# Patient Record
Sex: Male | Born: 1990 | Race: White | Hispanic: No | Marital: Married | Smoking: Never smoker
Health system: Southern US, Community
[De-identification: ages and names within clinical notes are randomized; demographics above are authoritative.]

## PROBLEM LIST (undated history)

## (undated) DIAGNOSIS — R011 Cardiac murmur, unspecified: Secondary | ICD-10-CM

## (undated) DIAGNOSIS — J302 Other seasonal allergic rhinitis: Secondary | ICD-10-CM

---
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
## 2017-06-12 DIAGNOSIS — J029 Acute pharyngitis, unspecified: Secondary | ICD-10-CM | POA: Diagnosis not present

## 2018-10-23 DIAGNOSIS — J029 Acute pharyngitis, unspecified: Secondary | ICD-10-CM | POA: Diagnosis not present

## 2019-10-30 ENCOUNTER — Other Ambulatory Visit: Payer: Self-pay

## 2019-10-30 DIAGNOSIS — Z20822 Contact with and (suspected) exposure to covid-19: Secondary | ICD-10-CM

## 2019-11-01 LAB — NOVEL CORONAVIRUS, NAA: SARS-CoV-2, NAA: NOT DETECTED

## 2020-04-04 ENCOUNTER — Ambulatory Visit (HOSPITAL_COMMUNITY)
Admission: EM | Admit: 2020-04-04 | Discharge: 2020-04-04 | Disposition: A | Discharge status: Home / Self Care | Payer: 59 | Attending: Emergency Medicine | Admitting: Emergency Medicine

## 2020-04-04 ENCOUNTER — Encounter (HOSPITAL_COMMUNITY): Payer: Self-pay

## 2020-04-04 ENCOUNTER — Other Ambulatory Visit: Payer: Self-pay

## 2020-04-04 DIAGNOSIS — R0789 Other chest pain: Secondary | ICD-10-CM | POA: Diagnosis not present

## 2020-04-04 HISTORY — DX: Other seasonal allergic rhinitis: J30.2

## 2020-04-04 HISTORY — DX: Cardiac murmur, unspecified: R01.1

## 2020-04-04 MED ORDER — IBUPROFEN 600 MG PO TABS: 600.0000 mg | ORAL_TABLET | Freq: Four times a day (QID) | ORAL | 0 refills | Status: AC | PRN

## 2020-04-04 MED ORDER — CYCLOBENZAPRINE HCL 10 MG PO TABS: 10.0000 mg | ORAL_TABLET | Freq: Two times a day (BID) | ORAL | 0 refills | Status: AC | PRN

## 2020-04-04 NOTE — Discharge Instructions (Addendum)
Take ibuprofen and muscle relaxant for pain. I also recommend flonase for allergies. You can take this along with Zyrtec. I have also sent a referral for and allergist for you.

## 2020-04-04 NOTE — ED Triage Notes (Signed)
Pt c/o 8/10 sharp right chest pain, pain in right side of neck, and pain in right upper back when he inhales. He states it started Monday night then went away. Then it began again last night. Pt has non labored breathing. Pt skin color is WNL.

## 2020-04-04 NOTE — ED Provider Notes (Signed)
[facility]    CSN: [id number] Arrival date & time: 04/04/20  1014      History   Chief Complaint Chief Complaint  Patient presents with  . Chest Pain    HPI Micheal Pratt is a 29 y.o. male. Presenting with Chest pain. Pain began 5 days ago and was located midline chest and radiated to his right chest and up to his right lateral neck. Pain was constant for 2-3 days and spontaneously resolved. He reports pain began again last night and woke him from sleep. Still present this morning. He reports a significant history of allergies that is poorly treated with zyrtec. He has had head congestion, rhinorrhea, sneezing and itchy eyes. Denies cough, chest pain or shortness of breath. Does not exercise regularly but has had significant amounts of stress as he is planning a large wedding in 2 weeks  HPI  Past Medical History:  Diagnosis Date  . Heart murmur   . Seasonal allergies     There are no problems to display for this patient.   History reviewed. No pertinent surgical history.     Home Medications    Prior to Admission medications   Medication Sig Start Date End Date Taking? Authorizing Provider  cetirizine (ZYRTEC) 10 MG tablet Take 10 mg by mouth daily.   Yes [provider]    Family History Family History  Problem Relation Age of Onset  . Healthy Mother   . Healthy Father     Social History Social History   Tobacco Use  . Smoking status: Never Smoker  . Smokeless tobacco: Never Used  Substance Use Topics  . Alcohol use: Never  . Drug use: Never     Allergies   Patient has no known allergies.   Review of Systems Review of Systems  Constitutional: Negative for chills, diaphoresis, fatigue, fever and unexpected weight change.  HENT: Positive for sinus pressure, sinus pain and sneezing. Negative for sore throat and trouble swallowing.   Eyes: Positive for itching. Negative for visual disturbance.  Respiratory: Negative for cough,  shortness of breath and wheezing.   Cardiovascular: Positive for chest pain. Negative for palpitations.  Gastrointestinal: Negative for abdominal pain, constipation, diarrhea, nausea and vomiting.  Genitourinary: Negative for difficulty urinating and frequency.  Musculoskeletal: Negative for arthralgias, back pain, joint swelling and myalgias.  Neurological: Negative for dizziness, weakness and headaches.  Psychiatric/Behavioral: Negative for dysphoric mood. The patient is nervous/anxious.      Physical Exam Triage Vital Signs ED Triage Vitals  Enc Vitals Group     BP 04/04/20 1039 139/74     Pulse Rate 04/04/20 1039 69     Resp 04/04/20 1039 16     Temp 04/04/20 1039 98.4 F (36.9 C)     Temp Source 04/04/20 1039 Oral     SpO2 04/04/20 1039 100 %     Weight 04/04/20 1040 150 lb (68 kg)     Height 04/04/20 1040 5\' 9"  (1.753 m)     Head Circumference --      Peak Flow --      Pain Score 04/04/20 1040 8     Pain Loc --      Pain Edu? --      Excl. in GC? --    No data found.  Updated Vital Signs BP 139/74   Pulse 69   Temp 98.4 F (36.9 C) (Oral)   Resp 16   Ht 5\' 9"  (1.753 m)   Wt  150 lb (68 kg)   SpO2 100%   BMI 22.15 kg/m      Physical Exam Constitutional:      General: He is not in acute distress.    Appearance: He is well-developed and normal weight. He is not ill-appearing.  HENT:     Head: Normocephalic and atraumatic.  Eyes:     Extraocular Movements: Extraocular movements intact.     Pupils: Pupils are equal, round, and reactive to light.  Neck:     Thyroid: No thyromegaly.  Cardiovascular:     Rate and Rhythm: Normal rate and regular rhythm.     Heart sounds: Normal heart sounds. No murmur. No systolic murmur. No diastolic murmur. No friction rub. No gallop.   Pulmonary:     Effort: Pulmonary effort is normal. No tachypnea or respiratory distress.     Breath sounds: Normal breath sounds. No stridor. No wheezing, rhonchi or rales.  Chest:      Chest wall: Tenderness (tenderness with substernal palpation and right chest wall and lateral neck) present.  Musculoskeletal:        General: Normal range of motion.     Cervical back: Normal range of motion and neck supple.     Right lower leg: No edema.     Left lower leg: No edema.  Lymphadenopathy:     Cervical: No cervical adenopathy.  Skin:    General: Skin is warm and dry.     Findings: No erythema.  Neurological:     General: No focal deficit present.     Mental Status: He is alert and oriented to person, place, and time.  Psychiatric:        Mood and Affect: Mood normal. Mood is not anxious.        Behavior: Behavior normal. Behavior is not agitated.      UC Treatments / Results    Initial Impression / Assessment and Plan / UC Course  I have reviewed the triage vital signs and the nursing notes.  Pertinent labs & imaging results that were available during my care of the patient were reviewed by me and considered in my medical decision making (see chart for details).   Musculoskeletal chest pain   Final Clinical Impressions(s) / UC Diagnoses    [name] has musculoskeletal chest pain. Possibly from his Allergies or from Anxiety. I advised taking ibuprofen and flexeril for discomfort. Also trying flonase to help control allergies better. He would also like a referral to an allergist.      Domingo Dimes, PA-C 04/04/20 1203

## 2020-05-26 ENCOUNTER — Ambulatory Visit: Payer: 59 | Admitting: Allergy & Immunology

## 2020-05-26 ENCOUNTER — Other Ambulatory Visit: Payer: Self-pay

## 2020-05-26 ENCOUNTER — Encounter: Payer: Self-pay | Admitting: Allergy & Immunology

## 2020-05-26 VITALS — BP 128/70 | HR 70 | Temp 98.4°F | Resp 16 | Ht 67.5 in | Wt 157.0 lb

## 2020-05-26 DIAGNOSIS — J302 Other seasonal allergic rhinitis: Secondary | ICD-10-CM

## 2020-05-26 DIAGNOSIS — R0602 Shortness of breath: Secondary | ICD-10-CM

## 2020-05-26 DIAGNOSIS — J3089 Other allergic rhinitis: Secondary | ICD-10-CM

## 2020-05-26 MED ORDER — AZELASTINE HCL 0.1 % NA SOLN: NASAL | 5 refills | Status: AC

## 2020-05-26 MED ORDER — CETIRIZINE HCL 10 MG PO TABS: 10.0000 mg | ORAL_TABLET | Freq: Every day | ORAL | 5 refills | Status: AC

## 2020-05-26 MED ORDER — FLUTICASONE PROPIONATE 50 MCG/ACT NA SUSP
1.0000 | Freq: Every day | NASAL | 3 refills | Status: AC
Start: 2020-05-26 — End: ?

## 2020-05-26 NOTE — Progress Notes (Signed)
NEW PATIENT  Date of Service/Encounter:  05/27/20  Referring provider: Patient, No Pcp Per   Assessment:   Seasonal and perennial allergic rhinitis (grasses, ragweed, weeds, trees, outdoor molds, dust mites, cat and dog)  SOB (shortness of breath)  Plan/Recommendations:   1. Seasonal and perennial allergic rhinitis - Testing today showed: grasses, ragweed, weeds, trees, outdoor molds, dust mites, cat and dog - Copy of test results provided.  - Avoidance measures provided. - Continue with: Zyrtec (cetirizine) 63m tablet once daily and Flonase (fluticasone) one spray per nostril daily - Start taking: Astelin (azelastine) 2 sprays per nostril 1-2 times daily as needed - You can use an extra dose of the antihistamine (Zyrtec), if needed, for breakthrough symptoms.  - Consider nasal saline rinses 1-2 times daily to remove allergens from the nasal cavities as well as help with mucous clearance (this is especially helpful to do before the nasal sprays are given) - Consider allergy shots as a means of long-term control. - Allergy shots "re-train" and "reset" the immune system to ignore environmental allergens and decrease the resulting immune response to those allergens (sneezing, itchy watery eyes, runny nose, nasal congestion, etc).    - Allergy shots improve symptoms in 75-85% of patients.  - We can discuss more at the next appointment if the medications are not working for you.  2. SOB (shortness of breath) - Lung testing looked great today. - I do not think that you need an albuterol inhaler. - I think your chest pain was related to allergic exposures, as you thought.  3. Return in about 3 months (around 08/26/2020). This can be an in-person, a virtual Webex or a telephone follow up visit.  Subjective:   Micheal KINGSLEYis a 29y.o. male presenting today for evaluation of  Chief Complaint  Patient presents with  . Allergies    itchy/watery eyes, nasal congestion, scratchy  throat    Micheal MALACHIhas a history of the following: Patient Active Problem List   Diagnosis Date Noted  . Seasonal and perennial allergic rhinitis 05/27/2020    History obtained from: chart review and patient.  Micheal Loftswas referred by Patient, No Pcp Per.      MDaisyis a 29y.o. male presenting for an evaluation of environmental allergies. He has had problems with allergies since he was in high school. He remembers that it popped up during that time and has gotten progressively worse. He has been on a number of antihistamines (Claritin for 8 years and then recently changed to Zyrtec). He is currently on Flonase. Both the Zyrtec and the Flonase were added when he showed up to Urgent Care for chest pain in May 2021  He had an episode one month ago when he had some shortness of breath, rhinorrhea, and difficulty breathing. He had scratchy throat. He was given cyclobenzaprine as well as ibuprofen for his chest pain. He has never needed any albuterol and has never needed prednisone for chest pain.   He has never bene tested in the past. Animals definitely make it worse (he grew up with animals but has none currently). He does report that the pollen makes his symptoms much worse. His entire family has allergies as well. He does not get recurrent sinus infections. He does not remember the last time that he needed antibiotics at all.   Otherwise, there is no history of other atopic diseases, including asthma, food allergies, drug allergies, stinging insect allergies, eczema, urticaria or  contact dermatitis. There is no significant infectious history. Vaccinations are up to date.    Past Medical History: Patient Active Problem List   Diagnosis Date Noted  . Seasonal and perennial allergic rhinitis 05/27/2020    Medication List:  Allergies as of 05/26/2020   No Known Allergies     Medication List       Accurate as of May 26, 2020 11:59 PM. If you have any questions, ask  your nurse or doctor.        azelastine 0.1 % nasal spray Commonly known as: ASTELIN 2 sprays per nostril 1-2 times daily as needed. Started by: Valentina Shaggy, MD   cetirizine 10 MG tablet Commonly known as: ZYRTEC Take 1 tablet (10 mg total) by mouth daily.   cyclobenzaprine 10 MG tablet Commonly known as: FLEXERIL Take 1 tablet (10 mg total) by mouth 2 (two) times daily as needed for muscle spasms.   fluticasone 50 MCG/ACT nasal spray Commonly known as: FLONASE Place 1 spray into both nostrils daily. Started by: Valentina Shaggy, MD   ibuprofen 600 MG tablet Commonly known as: ADVIL Take 1 tablet (600 mg total) by mouth every 6 (six) hours as needed.       Birth History: born at term without complications  Developmental History: [name] has met all milestones on time. He has required no speech therapy, occupational therapy and physical therapy.   Past Surgical History: History reviewed. No pertinent surgical history.   Family History: Family History  Problem Relation Age of Onset  . Healthy Mother   . Healthy Father      Social History: Micheal Pratt lives at home in an apartment that is 27 years old.  There is carpeting and wood in the main living areas and carpeting in the bedroom.  There is electric heating and central cooling.  There are no animals inside or outside of the home.  There are no dust mite covers on the bedding.  There is no tobacco exposure.  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  He is not exposed to dust or chemicals.  He does not have a HEPA filter.  He does not live near an interstate or industrial area.   Review of Systems  Constitutional: Negative.  Negative for chills, fever, malaise/fatigue and weight loss.  HENT: Positive for congestion. Negative for ear discharge, ear pain and sinus pain.        Positive for postnasal drip. Positive for throat clearing. Positive for intermittent ear fullness.  Eyes: Negative for  pain, discharge and redness.  Respiratory: Negative for cough, sputum production, shortness of breath and wheezing.   Cardiovascular: Negative.  Negative for chest pain and palpitations.  Gastrointestinal: Negative for abdominal pain, constipation, diarrhea, heartburn, nausea and vomiting.  Skin: Negative.  Negative for itching and rash.  Neurological: Negative for dizziness and headaches.  Endo/Heme/Allergies: Positive for environmental allergies. Does not bruise/bleed easily.       Objective:   Blood pressure 128/70, pulse 70, temperature 98.4 F (36.9 C), temperature source Temporal, resp. rate 16, height 5' 7.5" (1.715 m), weight 157 lb (71.2 kg), SpO2 96 %. Body mass index is 24.23 kg/m.   Physical Exam:   Physical Exam Constitutional:      Appearance: He is well-developed.  HENT:     Head: Normocephalic and atraumatic.     Right Ear: Tympanic membrane, ear canal and external ear normal. No drainage, swelling or tenderness. Tympanic membrane is not injected, scarred,  erythematous, retracted or bulging.     Left Ear: Tympanic membrane, ear canal and external ear normal. No drainage, swelling or tenderness. Tympanic membrane is not injected, scarred, erythematous, retracted or bulging.     Nose: No nasal deformity, septal deviation, mucosal edema or rhinorrhea.     Right Turbinates: Enlarged, swollen and pale.     Left Turbinates: Enlarged, swollen and pale.     Right Sinus: No maxillary sinus tenderness or frontal sinus tenderness.     Left Sinus: No maxillary sinus tenderness or frontal sinus tenderness.     Comments: No polyps noted.     Mouth/Throat:     Mouth: Mucous membranes are not pale and not dry.     Pharynx: Uvula midline.     Comments: Cobblestoning present in the posterior oropharynx. Eyes:     General:        Right eye: No discharge.        Left eye: No discharge.     Conjunctiva/sclera: Conjunctivae normal.     Right eye: Right conjunctiva is not  injected. No chemosis.    Left eye: Left conjunctiva is not injected. No chemosis.    Pupils: Pupils are equal, round, and reactive to light.  Cardiovascular:     Rate and Rhythm: Normal rate and regular rhythm.     Heart sounds: Normal heart sounds.  Pulmonary:     Effort: Pulmonary effort is normal. No tachypnea, accessory muscle usage or respiratory distress.     Breath sounds: Normal breath sounds. No wheezing, rhonchi or rales.     Comments: Moving air well in all lung fields.  No increased work of breathing. Chest:     Chest wall: No tenderness.  Abdominal:     Tenderness: There is no abdominal tenderness. There is no guarding or rebound.  Lymphadenopathy:     Head:     Right side of head: No submandibular, tonsillar or occipital adenopathy.     Left side of head: No submandibular, tonsillar or occipital adenopathy.     Cervical: No cervical adenopathy.  Skin:    Coloration: Skin is not pale.     Findings: No abrasion, erythema, petechiae or rash. Rash is not papular, urticarial or vesicular.  Neurological:     Mental Status: He is alert.       Diagnostic studies:    Spirometry: results normal (FEV1: 3.56/85%, FVC: 4.83/96%, FEV1/FVC: 74%).    Spirometry consistent with normal pattern.    Allergy Studies:     Airborne Adult Perc - 05/26/20 1540    Time Antigen Placed 1540    Allergen Manufacturer Lavella Hammock    Location Back    Number of Test 59    Panel 1 Select    1. Control-Buffer 50% Glycerol Negative    2. Control-Histamine 1 mg/ml 2+    3. Albumin saline Negative    4. [place] 4+    5. [place] 4+    6. [place] 4+    7. [place] Blue 3+    8. Meadow Fescue 3+    9. Perennial Rye 4+    10. Sweet Vernal 4+    11. Timothy 4+    12. Cocklebur 2+    13. Burweed Marshelder 3+    14. Ragweed, short 3+    15. Ragweed, Giant 3+    16. Plantain,  English 4+    17. Lamb's Quarters 3+    18. Sheep Sorrell 4+    19. Rough Pigweed 4+  [place], Rough 3+     21. Mugwort, Common 4+    22. Ash mix 2+    23. Birch mix Negative    24. Beech American 3+    25. Box, Elder 3+    26. Cedar, red 3+    27. Cottonwood, Eastern 3+    28. Elm mix 3+    29. Hickory 2+    30. Maple mix 2+    31. Oak, [place] mix 3+    32. Pecan Pollen 3+    33. Pine mix Negative    34. Sycamore Eastern 2+    35. Magnolia, Black Pollen 3+    36. Alternaria alternata 3+    37. Cladosporium Herbarum Negative    38. Aspergillus mix Negative    39. Penicillium mix Negative    40. Bipolaris sorokiniana (Helminthosporium) Negative    41. Drechslera spicifera (Curvularia) Negative    42. Mucor plumbeus Negative    43. Fusarium moniliforme Negative    44. Aureobasidium pullulans (pullulara) Negative    45. Rhizopus oryzae Negative    46. Botrytis cinera Negative    47. Epicoccum nigrum Negative    48. Phoma betae Negative    49. Candida Albicans Negative    50. Trichophyton mentagrophytes Negative    51. Mite, D Farinae  5,000 AU/ml 4+    52. Mite, D Pteronyssinus  5,000 AU/ml 4+    53. Cat Hair 10,000 BAU/ml 4+    54.  Dog Epithelia 3+    55. Mixed Feathers Negative    56. Horse Epithelia Negative    57. Cockroach, German Negative    58. Mouse Negative    59. Tobacco Leaf Negative          Intradermal - 05/26/20 1628    Time Antigen Placed 0415    Allergen Manufacturer Lavella Hammock    Location Arm    Number of Test 5    Intradermal Select    Control Negative    Mold 2 Negative    Mold 3 Negative    Mold 4 Negative    Cockroach Negative           Allergy testing results were read and interpreted by myself, documented by clinical staff.         Salvatore Marvel, MD [facility] [place] [facility] [place]

## 2020-05-26 NOTE — Patient Instructions (Addendum)
1. Seasonal and perennial allergic rhinitis - Testing today showed: grasses, ragweed, weeds, trees, outdoor molds, dust mites, cat and dog - Copy of test results provided.  - Avoidance measures provided. - Continue with: Zyrtec (cetirizine) 10mg  tablet once daily and Flonase (fluticasone) one spray per nostril daily - Start taking: Astelin (azelastine) 2 sprays per nostril 1-2 times daily as needed - You can use an extra dose of the antihistamine (Zyrtec), if needed, for breakthrough symptoms.  - Consider nasal saline rinses 1-2 times daily to remove allergens from the nasal cavities as well as help with mucous clearance (this is especially helpful to do before the nasal sprays are given) - Consider allergy shots as a means of long-term control. - Allergy shots "re-train" and "reset" the immune system to ignore environmental allergens and decrease the resulting immune response to those allergens (sneezing, itchy watery eyes, runny nose, nasal congestion, etc).    - Allergy shots improve symptoms in 75-85% of patients.  - We can discuss more at the next appointment if the medications are not working for you.  2. SOB (shortness of breath) - Lung testing looked great today. - I do not think that you need an albuterol inhaler. - I think your chest pain was related to allergic exposures, as you thought.  3. Return in about 3 months (around 08/26/2020). This can be an in-person, a virtual Webex or a telephone follow up visit.   Please inform 08/28/2020 of any Emergency Department visits, hospitalizations, or changes in symptoms. Call us before going to the ED for breathing or allergy symptoms since we might be able to fit you in for a sick visit. Feel free to contact us anytime with any questions, problems, or concerns.  It was a pleasure to meet you today!  Websites that have reliable patient information: 1. American Academy of Asthma, Allergy, and Immunology: www.aaaai.org 2. Food Allergy Research and  Education (FARE): foodallergy.org 3. Mothers of Asthmatics: http://www.asthmacommunitynetwork.org 4. American College of Allergy, Asthma, and Immunology: www.acaai.org   COVID-19 Vaccine Information can be found at: [place] For questions related to vaccine distribution or appointments, please email [email or web address] or call [phone].     "Like" [phone] on Facebook and Instagram for our latest updates!        Make sure you are registered to vote! If you have moved or changed any of your contact information, you will need to get this updated before voting!  In some cases, you MAY be able to register to vote online: [place]    Reducing Pollen Exposure  The American Academy of Allergy, Asthma and Immunology suggests the following steps to reduce your exposure to pollen during allergy seasons.    1. Do not hang sheets or clothing out to dry; pollen may collect on these items. 2. Do not mow lawns or spend time around freshly cut grass; mowing stirs up pollen. 3. Keep windows closed at night.  Keep car windows closed while driving. 4. Minimize morning activities outdoors, a time when pollen counts are usually at their highest. 5. Stay indoors as much as possible when pollen counts or humidity is high and on windy days when pollen tends to remain in the air longer. 6. Use air conditioning when possible.  Many air conditioners have filters that trap the pollen spores. 7. Use a HEPA room air filter to remove pollen form the indoor air you breathe.  Control of Mold Allergen   Mold and fungi can grow on a variety of surfaces provided  certain temperature and moisture conditions exist.  Outdoor molds grow on plants, decaying vegetation and soil.  The major outdoor mold, Alternaria and Cladosporium, are found in very high numbers during hot and dry conditions.  Generally, a late  Summer - Fall peak is seen for common outdoor fungal spores.  Rain will temporarily lower outdoor mold spore count, but counts rise rapidly when the rainy period ends.  The most important indoor molds are Aspergillus and Penicillium.  Dark, humid and poorly ventilated basements are ideal sites for mold growth.  The next most common sites of mold growth are the bathroom and the kitchen.  Outdoor (Seasonal) Mold Control  Positive outdoor molds via skin testing: Alternaria  1. Use air conditioning and keep windows closed 2. Avoid exposure to decaying vegetation. 3. Avoid leaf raking. 4. Avoid grain handling. 5. Consider wearing a face mask if working in moldy areas.      Control of Dust Mite Allergen    Dust mites play a major role in allergic asthma and rhinitis.  They occur in environments with high humidity wherever human skin is found.  Dust mites absorb humidity from the atmosphere (ie, they do not drink) and feed on organic matter (including shed human and animal skin).  Dust mites are a microscopic type of insect that you cannot see with the naked eye.  High levels of dust mites have been detected from mattresses, pillows, carpets, upholstered furniture, bed covers, clothes, soft toys and any woven material.  The principal allergen of the dust mite is found in its feces.  A gram of dust may contain 1,000 mites and 250,000 fecal particles.  Mite antigen is easily measured in the air during house cleaning activities.  Dust mites do not bite and do not cause harm to humans, other than by triggering allergies/asthma.    Ways to decrease your exposure to dust mites in your home:  1. Encase mattresses, box springs and pillows with a mite-impermeable barrier or cover   2. Wash sheets, blankets and drapes weekly in hot water (130 F) with detergent and dry them in a dryer on the hot setting.  3. Have the room cleaned frequently with a vacuum cleaner and a damp dust-mop.  For carpeting or rugs,  vacuuming with a vacuum cleaner equipped with a high-efficiency particulate air (HEPA) filter.  The dust mite allergic individual should not be in a room which is being cleaned and should wait 1 hour after cleaning before going into the room. 4. Do not sleep on upholstered furniture (eg, couches).   5. If possible removing carpeting, upholstered furniture and drapery from the home is ideal.  Horizontal blinds should be eliminated in the rooms where the person spends the most time (bedroom, study, television room).  Washable vinyl, roller-type shades are optimal. 6. Remove all non-washable stuffed toys from the bedroom.  Wash stuffed toys weekly like sheets and blankets above.   7. Reduce indoor humidity to less than 50%.  Inexpensive humidity monitors can be purchased at most hardware stores.  Do not use a humidifier as can make the problem worse and are not recommended.  Control of Dog or Cat Allergen  Avoidance is the best way to manage a dog or cat allergy. If you have a dog or cat and are allergic to dog or cats, consider removing the dog or cat from the home. If you have a dog or cat but don't want to find it a new home, or if your family  wants a pet even though someone in the household is allergic, here are some strategies that may help keep symptoms at bay:  1. Keep the pet out of your bedroom and restrict it to only a few rooms. Be advised that keeping the dog or cat in only one room will not limit the allergens to that room. 2. Don't pet, hug or kiss the dog or cat; if you do, wash your hands with soap and water. 3. High-efficiency particulate air (HEPA) cleaners run continuously in a bedroom or living room can reduce allergen levels over time. 4. Regular use of a high-efficiency vacuum cleaner or a central vacuum can reduce allergen levels. 5. Giving your dog or cat a bath at least once a week can reduce airborne allergen.  Allergy Shots   Allergies are the result of a chain reaction  that starts in the immune system. Your immune system controls how your body defends itself. For instance, if you have an allergy to pollen, your immune system identifies pollen as an invader or allergen. Your immune system overreacts by producing antibodies called Immunoglobulin E (IgE). These antibodies travel to cells that release chemicals, causing an allergic reaction.  The concept behind allergy immunotherapy, whether it is received in the form of shots or tablets, is that the immune system can be desensitized to specific allergens that trigger allergy symptoms. Although it requires time and patience, the payback can be long-term relief.  How Do Allergy Shots Work?  Allergy shots work much like a vaccine. Your body responds to injected amounts of a particular allergen given in increasing doses, eventually developing a resistance and tolerance to it. Allergy shots can lead to decreased, minimal or no allergy symptoms.  There generally are two phases: build-up and maintenance. Build-up often ranges from three to six months and involves receiving injections with increasing amounts of the allergens. The shots are typically given once or twice a week, though more rapid build-up schedules are sometimes used.  The maintenance phase begins when the most effective dose is reached. This dose is different for each person, depending on how allergic you are and your response to the build-up injections. Once the maintenance dose is reached, there are longer periods between injections, typically two to four weeks.  Occasionally doctors give cortisone-type shots that can temporarily reduce allergy symptoms. These types of shots are different and should not be confused with allergy immunotherapy shots.  Who Can Be Treated with Allergy Shots?  Allergy shots may be a good treatment approach for people with allergic rhinitis (hay fever), allergic asthma, conjunctivitis (eye allergy) or stinging insect allergy.    Before deciding to begin allergy shots, you should consider:  . The length of allergy season and the severity of your symptoms . Whether medications and/or changes to your environment can control your symptoms . Your desire to avoid long-term medication use . Time: allergy immunotherapy requires a major time commitment . Cost: may vary depending on your insurance coverage  Allergy shots for children age 18 and older are effective and often well tolerated. They might prevent the onset of new allergen sensitivities or the progression to asthma.  Allergy shots are not started on patients who are pregnant but can be continued on patients who become pregnant while receiving them. In some patients with other medical conditions or who take certain common medications, allergy shots may be of risk. It is important to mention other medications you talk to your allergist.   When Will I Feel  Better?  Some may experience decreased allergy symptoms during the build-up phase. For others, it may take as long as 12 months on the maintenance dose. If there is no improvement after a year of maintenance, your allergist will discuss other treatment options with you.  If you aren't responding to allergy shots, it may be because there is not enough dose of the allergen in your vaccine or there are missing allergens that were not identified during your allergy testing. Other reasons could be that there are high levels of the allergen in your environment or major exposure to non-allergic triggers like tobacco smoke.  What Is the Length of Treatment?  Once the maintenance dose is reached, allergy shots are generally continued for three to five years. The decision to stop should be discussed with your allergist at that time. Some people may experience a permanent reduction of allergy symptoms. Others may relapse and a longer course of allergy shots can be considered.  What Are the Possible Reactions?  The two  types of adverse reactions that can occur with allergy shots are local and systemic. Common local reactions include very mild redness and swelling at the injection site, which can happen immediately or several hours after. A systemic reaction, which is less common, affects the entire body or a particular body system. They are usually mild and typically respond quickly to medications. Signs include increased allergy symptoms such as sneezing, a stuffy nose or hives.  Rarely, a serious systemic reaction called anaphylaxis can develop. Symptoms include swelling in the throat, wheezing, a feeling of tightness in the chest, nausea or dizziness. Most serious systemic reactions develop within 30 minutes of allergy shots. This is why it is strongly recommended you wait in your doctor's office for 30 minutes after your injections. Your allergist is trained to watch for reactions, and his or her staff is trained and equipped with the proper medications to identify and treat them.  Who Should Administer Allergy Shots?  The preferred location for receiving shots is your prescribing allergist's office. Injections can sometimes be given at another facility where the physician and staff are trained to recognize and treat reactions, and have received instructions by your prescribing allergist.

## 2020-05-27 ENCOUNTER — Encounter: Payer: Self-pay | Admitting: Allergy & Immunology

## 2020-05-27 DIAGNOSIS — J3089 Other allergic rhinitis: Secondary | ICD-10-CM | POA: Insufficient documentation

## 2020-05-27 DIAGNOSIS — J302 Other seasonal allergic rhinitis: Secondary | ICD-10-CM | POA: Insufficient documentation

## 2021-06-19 ENCOUNTER — Ambulatory Visit
Admission: EM | Admit: 2021-06-19 | Discharge: 2021-06-19 | Disposition: A | Discharge status: Home / Self Care | Payer: BC Managed Care – PPO | Attending: Physician Assistant | Admitting: Physician Assistant

## 2021-06-19 ENCOUNTER — Encounter: Payer: Self-pay | Admitting: Emergency Medicine

## 2021-06-19 ENCOUNTER — Other Ambulatory Visit: Payer: Self-pay

## 2021-06-19 DIAGNOSIS — L089 Local infection of the skin and subcutaneous tissue, unspecified: Secondary | ICD-10-CM | POA: Diagnosis not present

## 2021-06-19 DIAGNOSIS — S80861A Insect bite (nonvenomous), right lower leg, initial encounter: Secondary | ICD-10-CM

## 2021-06-19 DIAGNOSIS — W57XXXA Bitten or stung by nonvenomous insect and other nonvenomous arthropods, initial encounter: Secondary | ICD-10-CM

## 2021-06-19 MED ORDER — DOXYCYCLINE HYCLATE 100 MG PO CAPS
100.0000 mg | ORAL_CAPSULE | Freq: Two times a day (BID) | ORAL | 0 refills | Status: AC
Start: 2021-06-19 — End: ?

## 2021-06-19 NOTE — ED Provider Notes (Signed)
[facility]    CSN: [id number] Arrival date & time: 06/19/21  1143      History   Chief Complaint Chief Complaint  Patient presents with   Insect Bite    HPI Micheal Pratt is a 30 y.o. male.   Pt complains of insect bites.  One bite has become red and swollen on right calf.  Bites occurred 5 days ago  The history is provided by the patient. No language interpreter was used.   Past Medical History:  Diagnosis Date   Heart murmur    Seasonal allergies     Patient Active Problem List   Diagnosis Date Noted   Seasonal and perennial allergic rhinitis 05/27/2020    History reviewed. No pertinent surgical history.     Home Medications    Prior to Admission medications   Medication Sig Start Date End Date Taking? Authorizing Provider  doxycycline (VIBRAMYCIN) 100 MG capsule Take 1 capsule (100 mg total) by mouth 2 (two) times daily. 06/19/21  Yes Cheron Schaumann K, PA-C  azelastine (ASTELIN) 0.1 % nasal spray 2 sprays per nostril 1-2 times daily as needed. 05/26/20   Alfonse Spruce, MD  cetirizine (ZYRTEC) 10 MG tablet Take 1 tablet (10 mg total) by mouth daily. 05/26/20   Alfonse Spruce, MD  cyclobenzaprine (FLEXERIL) 10 MG tablet Take 1 tablet (10 mg total) by mouth 2 (two) times daily as needed for muscle spasms. Patient not taking: Reported on 06/19/2021 04/04/20   Chilton Si, PA-C  fluticasone [facility]) 50 MCG/ACT nasal spray Place 1 spray into both nostrils daily. 05/26/20   Alfonse Spruce, MD  ibuprofen (ADVIL) 600 MG tablet Take 1 tablet (600 mg total) by mouth every 6 (six) hours as needed. 04/04/20   Chilton Si, PA-C    Family History Family History  Problem Relation Age of Onset   Healthy Mother    Healthy Father     Social History Social History   Tobacco Use   Smoking status: Never   Smokeless tobacco: Never  Vaping Use   Vaping Use: Never used  Substance Use Topics   Alcohol use: Never   Drug use:  Never     Allergies   Patient has no known allergies.   Review of Systems Review of Systems  All other systems reviewed and are negative.   Physical Exam Triage Vital Signs ED Triage Vitals  Enc Vitals Group     BP 06/19/21 1423 128/75     Pulse Rate 06/19/21 1423 (!) 58     Resp 06/19/21 1423 20     Temp 06/19/21 1423 97.9 F (36.6 C)     Temp Source 06/19/21 1423 Oral     SpO2 06/19/21 1423 98 %     Weight --      Height --      Head Circumference --      Peak Flow --      Pain Score 06/19/21 1433 2     Pain Loc --      Pain Edu? --      Excl. in GC? --    No data found.  Updated Vital Signs BP 128/75 (BP Location: Left Arm)   Pulse (!) 58   Temp 97.9 F (36.6 C) (Oral)   Resp 20   SpO2 98%   Visual Acuity Right Eye Distance:   Left Eye Distance:   Bilateral Distance:    Right Eye Near:   Left Eye  Near:    Bilateral Near:     Physical Exam Vitals reviewed.  Musculoskeletal:        General: Normal range of motion.  Skin:    Findings: Rash present.     Comments: 2cm red area right calf  Neurological:     General: No focal deficit present.     Mental Status: He is alert.  Psychiatric:        Mood and Affect: Mood normal.     UC Treatments / Results  Labs (all labs ordered are listed, but only abnormal results are displayed) Labs Reviewed - No data to display  EKG   Radiology No results found.  Procedures Procedures (including critical care time)  Medications Ordered in UC Medications - No data to display  Initial Impression / Assessment and Plan / UC Course  I have reviewed the triage vital signs and the nursing notes.  Pertinent labs & imaging results that were available during my care of the patient were reviewed by me and considered in my medical decision making (see chart for details).      Final Clinical Impressions(s) / UC Diagnoses   Final diagnoses:  Local skin infection  Insect bite of right lower leg, initial  encounter     Discharge Instructions      Use topical hydrocortisone to area of redness   ED Prescriptions     Medication Sig Dispense Auth. Provider   doxycycline (VIBRAMYCIN) 100 MG capsule Take 1 capsule (100 mg total) by mouth 2 (two) times daily. 20 capsule Elson Areas, [place]      PDMP not reviewed this encounter.   Elson Areas, [place] 06/20/21 [phone]

## 2021-06-19 NOTE — Discharge Instructions (Addendum)
Use topical hydrocortisone to area of redness

## 2021-06-19 NOTE — ED Triage Notes (Signed)
Pt here for insect bite to right calf x 5 days ago with worsening swelling and redness

## 2024-08-21 ENCOUNTER — Encounter (HOSPITAL_COMMUNITY): Payer: Self-pay | Admitting: Emergency Medicine

## 2024-08-21 ENCOUNTER — Other Ambulatory Visit: Payer: Self-pay

## 2024-08-21 ENCOUNTER — Emergency Department (HOSPITAL_COMMUNITY)
Admission: EM | Admit: 2024-08-21 | Discharge: 2024-08-21 | Disposition: A | Discharge status: Home / Self Care | Source: Ambulatory Visit

## 2024-08-21 ENCOUNTER — Emergency Department (HOSPITAL_COMMUNITY)

## 2024-08-21 DIAGNOSIS — M546 Pain in thoracic spine: Secondary | ICD-10-CM | POA: Insufficient documentation

## 2024-08-21 LAB — CBC WITH DIFFERENTIAL/PLATELET
Abs Immature Granulocytes: 0.01 K/uL (ref 0.00–0.07)
Basophils Absolute: 0 K/uL (ref 0.0–0.1)
Basophils Relative: 1 %
Eosinophils Absolute: 0.4 K/uL (ref 0.0–0.5)
Eosinophils Relative: 7 %
HCT: 44.3 % (ref 39.0–52.0)
Hemoglobin: 16.1 g/dL (ref 13.0–17.0)
Immature Granulocytes: 0 %
Lymphocytes Relative: 41 %
Lymphs Abs: 2.5 K/uL (ref 0.7–4.0)
MCH: 30.8 pg (ref 26.0–34.0)
MCHC: 36.3 g/dL — ABNORMAL HIGH (ref 30.0–36.0)
MCV: 84.9 fL (ref 80.0–100.0)
Monocytes Absolute: 0.5 K/uL (ref 0.1–1.0)
Monocytes Relative: 9 %
Neutro Abs: 2.6 K/uL (ref 1.7–7.7)
Neutrophils Relative %: 42 %
Platelets: 240 K/uL (ref 150–400)
RBC: 5.22 MIL/uL (ref 4.22–5.81)
RDW: 12 % (ref 11.5–15.5)
WBC: 6.1 K/uL (ref 4.0–10.5)
nRBC: 0 % (ref 0.0–0.2)

## 2024-08-21 LAB — I-STAT CHEM 8, ED
BUN: 13 mg/dL (ref 6–20)
Calcium, Ion: 1.18 mmol/L (ref 1.15–1.40)
Chloride: 101 mmol/L (ref 98–111)
Creatinine, Ser: 1 mg/dL (ref 0.61–1.24)
Glucose, Bld: 126 mg/dL — ABNORMAL HIGH (ref 70–99)
HCT: 44 % (ref 39.0–52.0)
Hemoglobin: 15 g/dL (ref 13.0–17.0)
Potassium: 4 mmol/L (ref 3.5–5.1)
Sodium: 140 mmol/L (ref 135–145)
TCO2: 28 mmol/L (ref 22–32)

## 2024-08-21 LAB — COMPREHENSIVE METABOLIC PANEL WITH GFR
ALT: 77 U/L — ABNORMAL HIGH (ref 0–44)
AST: 41 U/L (ref 15–41)
Albumin: 4.7 g/dL (ref 3.5–5.0)
Alkaline Phosphatase: 60 U/L (ref 38–126)
Anion gap: 9 (ref 5–15)
BUN: 14 mg/dL (ref 6–20)
CO2: 25 mmol/L (ref 22–32)
Calcium: 9.4 mg/dL (ref 8.9–10.3)
Chloride: 102 mmol/L (ref 98–111)
Creatinine, Ser: 0.81 mg/dL (ref 0.61–1.24)
GFR, Estimated: >60 mL/min (ref >60)
Glucose, Bld: 90 mg/dL (ref 70–99)
Potassium: 4.1 mmol/L (ref 3.5–5.1)
Sodium: 136 mmol/L (ref 135–145)
Total Bilirubin: 1.5 mg/dL — ABNORMAL HIGH (ref 0.0–1.2)
Total Protein: 7.6 g/dL (ref 6.5–8.1)

## 2024-08-21 LAB — URINALYSIS, W/ REFLEX TO CULTURE (INFECTION SUSPECTED)
Bacteria, UA: NONE SEEN
Bilirubin Urine: NEGATIVE
Glucose, UA: NEGATIVE mg/dL
Hgb urine dipstick: NEGATIVE
Ketones, ur: NEGATIVE mg/dL
Leukocytes,Ua: NEGATIVE
Nitrite: NEGATIVE
Protein, ur: NEGATIVE mg/dL
Specific Gravity, Urine: >1.046 — ABNORMAL HIGH (ref 1.005–1.030)
pH: 7 (ref 5.0–8.0)

## 2024-08-21 LAB — I-STAT CG4 LACTIC ACID, ED
Lactic Acid, Venous: 1 mmol/L (ref 0.5–1.9)
Lactic Acid, Venous: 1.4 mmol/L (ref 0.5–1.9)

## 2024-08-21 LAB — D-DIMER, QUANTITATIVE: D-Dimer, Quant: <0.27 ug{FEU}/mL (ref 0.00–0.50)

## 2024-08-21 LAB — TROPONIN I (HIGH SENSITIVITY)
Troponin I (High Sensitivity): 14 ng/L (ref <18)
Troponin I (High Sensitivity): 15 ng/L (ref <18)

## 2024-08-21 LAB — LIPASE, BLOOD: Lipase: 25 U/L (ref 11–51)

## 2024-08-21 MED ORDER — PREDNISONE 20 MG PO TABS: 40.0000 mg | ORAL_TABLET | Freq: Every day | ORAL | 0 refills | Status: AC

## 2024-08-21 MED ORDER — IOHEXOL 350 MG/ML SOLN
75.0000 mL | Freq: Once | INTRAVENOUS | Status: AC | PRN
  Administered 2024-08-21: 75 mL via INTRAVENOUS

## 2024-08-21 MED ORDER — CYCLOBENZAPRINE HCL 10 MG PO TABS: 10.0000 mg | ORAL_TABLET | Freq: Three times a day (TID) | ORAL | 0 refills | Status: AC

## 2024-08-21 NOTE — ED Triage Notes (Signed)
 Pt. Stated, Ive had mid back pain for 2 weeks .

## 2024-08-21 NOTE — ED Provider Notes (Signed)
 [place] EMERGENCY DEPARTMENT AT [facility] Provider Note   CSN: [id number] Arrival date & time: 08/21/24  9084     Patient presents with: Back Pain   Micheal Pratt is a 33 y.o. male.   33 year old male no past medical history presents to the ED with a chief complaint of middle back pain which has been ongoing for 1 week.  Reports he was seen via telehealth, prescribed indomethacin to help with back pain without any improvement in symptoms.  He describes this as an aching sensation to the thoracic spine exacerbated with bending over and alleviated by sitting up straight.  He reports there was no strenuous activity that reciprocated the back pain.  He has taken the medication but reports no improvement in symptoms.  He is not endorsing any shortness of breath.  Patient is a 14 there is a tingling sensation to the area however no weakness to upper extremities.  Denies any falls, history of IV drug use, fevers, prior cardiac history.  The history is provided by the patient and the spouse.  Back Pain Location:  Thoracic spine Quality:  Aching Radiates to:  L shoulder Pain severity:  Moderate Pain is:  Same all the time Onset quality:  Gradual Duration:  1 week Timing:  Constant Progression:  Worsening Chronicity:  New Relieved by:  Nothing Worsened by:  Deep breathing Associated symptoms: no abdominal pain, no chest pain, no fever, no headaches and no numbness        Prior to Admission medications   Medication Sig Start Date End Date Taking? Authorizing Provider  cyclobenzaprine  (FLEXERIL ) 10 MG tablet Take 1 tablet (10 mg total) by mouth 3 (three) times daily for 7 days. 08/21/24 08/28/24 Yes Beyonce Sawatzky, PA-C  predniSONE  (DELTASONE ) 20 MG tablet Take 2 tablets (40 mg total) by mouth daily for 5 days. 08/21/24 08/26/24 Yes Haylyn Halberg, PA-C    Allergies: Patient has no allergy information on record.    Review of Systems  Constitutional:  Negative for fever.   Respiratory:  Positive for shortness of breath.   Cardiovascular:  Negative for chest pain and leg swelling.  Gastrointestinal:  Negative for abdominal pain, nausea and vomiting.  Genitourinary:  Negative for flank pain.  Musculoskeletal:  Positive for back pain and myalgias.  Neurological:  Negative for numbness and headaches.  All other systems reviewed and are negative.   Updated Vital Signs BP 131/85 (BP Location: Left Arm)   Pulse 83   Temp 98.1 F (36.7 C) (Oral)   Resp 14   Ht 5' 9 (1.753 m)   Wt 81.6 kg   SpO2 100%   BMI 26.58 kg/m   Physical Exam Vitals and nursing note reviewed.  Constitutional:      Appearance: He is well-developed.  HENT:     Head: Normocephalic and atraumatic.  Eyes:     General: No scleral icterus.    Pupils: Pupils are equal, round, and reactive to light.  Cardiovascular:     Heart sounds: Normal heart sounds.  Pulmonary:     Effort: Pulmonary effort is normal.     Breath sounds: Normal breath sounds. No wheezing.  Chest:     Chest wall: No tenderness.  Abdominal:     General: Bowel sounds are normal. There is no distension.     Palpations: Abdomen is soft.     Tenderness: There is no abdominal tenderness.  Musculoskeletal:        General: No deformity.  Cervical back: Normal range of motion.     Thoracic back: Tenderness present.       Back:  Skin:    General: Skin is warm and dry.  Neurological:     Mental Status: He is alert and oriented to person, place, and time.     Comments: Moves upper and lower extremities without any weakness.     (all labs ordered are listed, but only abnormal results are displayed) Labs Reviewed  CBC WITH DIFFERENTIAL/PLATELET - Abnormal; Notable for the following components:      Result Value   MCHC 36.3 (*)    All other components within normal limits  COMPREHENSIVE METABOLIC PANEL WITH GFR - Abnormal; Notable for the following components:   ALT 77 (*)    Total Bilirubin 1.5 (*)    All  other components within normal limits  I-STAT CHEM 8, ED - Abnormal; Notable for the following components:   Glucose, Bld 126 (*)    All other components within normal limits  LIPASE, BLOOD  D-DIMER, QUANTITATIVE  URINALYSIS, W/ REFLEX TO CULTURE (INFECTION SUSPECTED)  I-STAT CG4 LACTIC ACID, ED  I-STAT CG4 LACTIC ACID, ED  TROPONIN I (HIGH SENSITIVITY)  TROPONIN I (HIGH SENSITIVITY)    EKG: None  Radiology: CT Angio Chest/Abd/Pel for Dissection W and/or Wo Contrast Result Date: 08/21/2024 CLINICAL DATA:  Aortic aneurysm suspected. Epigastric abdominal pain. Pleuritic pain. EXAM: CT ANGIOGRAPHY CHEST, ABDOMEN AND PELVIS TECHNIQUE: Non-contrast CT of the chest was initially obtained. Multidetector CT imaging through the chest, abdomen and pelvis was performed using the standard protocol during bolus administration of intravenous contrast. Multiplanar reconstructed images and MIPs were obtained and reviewed to evaluate the vascular anatomy. RADIATION DOSE REDUCTION: This exam was performed according to the departmental dose-optimization program which includes automated exposure control, adjustment of the mA and/or kV according to patient size and/or use of iterative reconstruction technique. CONTRAST:  75mL OMNIPAQUE  IOHEXOL  350 MG/ML SOLN COMPARISON:  None Available. FINDINGS: CTA CHEST FINDINGS Cardiovascular: No intramural hematoma noted in the thoracic aorta on the unenhanced images. Thoracic aorta is normal in caliber without aneurysm, dissection, vasculitis or significant stenosis. Normal cardiac size. No pericardial effusion. No aortic aneurysm. Mediastinum/Nodes: Visualized thyroid gland appears grossly unremarkable. No solid / cystic mediastinal masses. The esophagus is nondistended precluding optimal assessment. No axillary, mediastinal or hilar lymphadenopathy by size criteria. Lungs/Pleura: The central tracheo-bronchial tree is patent. There is mild, smooth, circumferential thickening of  the segmental and subsegmental bronchial walls, throughout bilateral lungs, which is nonspecific. Findings are most commonly seen with bronchitis or reactive airway disease, such as asthma. No mass or consolidation. No pleural effusion or pneumothorax. There is a 2 x 3 mm solid noncalcified nodule in the right lung upper lobe (series 7, image 31). No routine follow-up is recommended in patient without known malignancy. Musculoskeletal: The visualized soft tissues of the chest wall are grossly unremarkable. No suspicious osseous lesions. Review of the MIP images confirms the above findings. CTA ABDOMEN AND PELVIS FINDINGS VASCULAR Aorta: Normal caliber aorta without aneurysm, dissection, vasculitis or significant stenosis. Celiac: There is subtle fat stranding along the proximal portion of the celiac trunk (series 6, images 147-151). However, no focal wall thickening. The artery is otherwise patent without evidence of aneurysm, dissection or significant stenosis. SMA: Patent without evidence of aneurysm, dissection, vasculitis or significant stenosis. Renals: Both renal arteries are patent without evidence of aneurysm, dissection, vasculitis, fibromuscular dysplasia or significant stenosis. IMA: Patent without evidence of aneurysm, dissection, vasculitis or  significant stenosis. Inflow: Patent without evidence of aneurysm, dissection, vasculitis or significant stenosis. Veins: No obvious venous abnormality within the limitations of this arterial phase study. Review of the MIP images confirms the above findings. NON-VASCULAR Hepatobiliary: The liver is normal in size. Non-cirrhotic configuration. No suspicious mass. These is mild diffuse hepatic steatosis. No intrahepatic or extrahepatic bile duct dilation. No calcified gallstones. Normal gallbladder wall thickness. No pericholecystic inflammatory changes. Pancreas: Unremarkable. No pancreatic ductal dilatation or surrounding inflammatory changes. Spleen: Within  normal limits. No focal lesion. Adrenals/Urinary Tract: Adrenal glands are unremarkable. No suspicious renal mass. No hydronephrosis. No renal or ureteric calculi. Unremarkable urinary bladder. Stomach/Bowel: No disproportionate dilation of the small or large bowel loops. No evidence of abnormal bowel wall thickening or inflammatory changes. The appendix is unremarkable. Vascular/Lymphatic: No ascites or pneumoperitoneum. No abdominal or pelvic lymphadenopathy, by size criteria. Reproductive: Normal size prostate. Symmetric seminal vesicles. Other: There is a tiny fat containing umbilical hernia. The soft tissues and abdominal wall are otherwise unremarkable. Musculoskeletal: No suspicious osseous lesions. Review of the MIP images confirms the above findings. IMPRESSION: 1. No acute aortic syndrome. No thoracic or abdominal aortic aneurysm, dissection or vasculitis. 2. There is subtle fat stranding along the proximal portion of the celiac trunk, which is otherwise patent without evidence of aneurysm, dissection or significant stenosis. Findings are nonspecific and commonly seen with vasculitis. Correlate clinically. 3. There is mild, smooth, circumferential thickening of the segmental and subsegmental bronchial walls, throughout bilateral lungs, which is nonspecific. Findings are most commonly seen with bronchitis or reactive airway disease, such as asthma. 4. Otherwise essentially unremarkable exam, as described above. Electronically Signed   By: Ree Molt M.D.   On: 08/21/2024 18:04   US  Abdomen Limited RUQ (LIVER/GB) Result Date: 08/21/2024 CLINICAL DATA:  Epigastric abdominal pain. EXAM: ULTRASOUND ABDOMEN LIMITED RIGHT UPPER QUADRANT COMPARISON:  None Available. FINDINGS: Gallbladder: No gallstones or wall thickening visualized. No sonographic Murphy sign noted by sonographer. Common bile duct: Diameter: 3 mm, within normal limits. No intrahepatic biliary ductal dilatation. Liver: Diffusely increased in  echogenicity. No focal lesion. Portal vein is patent on color Doppler imaging with normal direction of blood flow towards the liver. Other: None. IMPRESSION: 1. No acute findings. 2. Hepatic steatosis. Electronically Signed   By: Newell Eke M.D.   On: 08/21/2024 11:30   DG Chest 2 View Result Date: 08/21/2024 CLINICAL DATA:  Pleuritic pain. EXAM: CHEST - 2 VIEW COMPARISON:  None Available. FINDINGS: Normal mediastinum and cardiac silhouette. Normal pulmonary vasculature. No evidence of effusion, infiltrate, or pneumothorax. No acute bony abnormality. IMPRESSION: No acute cardiopulmonary process. Electronically Signed   By: Jackquline Boxer M.D.   On: 08/21/2024 11:07     Procedures   Medications Ordered in the ED  iohexol  (OMNIPAQUE ) 350 MG/ML injection 75 mL (75 mLs Intravenous Contrast Given 08/21/24 1732)                                    Medical Decision Making Amount and/or Complexity of Data Reviewed Radiology: ordered.  Risk Prescription drug management.     This patient presents to the ED for concern of back pain, this involves a number of treatment options, and is a complaint that carries with it a high risk of complications and morbidity.  The differential diagnosis includes MSK, pyelonephritis, dissection.   Co morbidities: Discussed in HPI   Brief History:  See HPI  EMR reviewed including pt PMHx, past surgical history and past visits to ER.   See HPI for more details   Lab Tests:  I ordered and independently interpreted labs.  The pertinent results include:    I personally reviewed all laboratory work and imaging. Metabolic panel without any acute abnormality specifically kidney function within normal limits and no significant electrolyte abnormalities. CBC without leukocytosis or significant anemia.  Lactic acid is negative.  Dimer is also negative along with troponin.   Imaging Studies:  Right upper quadrant ultrasound within normal  limits. Chest x-ray without any widened mediastinum, no acute findings. CT Angio showed: IMPRESSION:  1. No acute aortic syndrome. No thoracic or abdominal aortic  aneurysm, dissection or vasculitis.  2. There is subtle fat stranding along the proximal portion of the  celiac trunk, which is otherwise patent without evidence of  aneurysm, dissection or significant stenosis. Findings are  nonspecific and commonly seen with vasculitis. Correlate clinically.  3. There is mild, smooth, circumferential thickening of the  segmental and subsegmental bronchial walls, throughout bilateral  lungs, which is nonspecific. Findings are most commonly seen with  bronchitis or reactive airway disease, such as asthma.  4. Otherwise essentially unremarkable exam, as described above.   Cardiac Monitoring:  The patient was maintained on a cardiac monitor.  I personally viewed and interpreted the cardiac monitored which showed an underlying rhythm of: NSR 84 EKG non-ischemic  Medicines ordered:  N/A  Reevaluation:  After the interventions noted above I re-evaluated patient and found that they have :stayed the same Social Determinants of Health:  The patient's social determinants of health were a factor in the care of this patient  Problem List / ED Course:  Patient presenting to the ED with a chief complaint of midthoracic pain which has been ongoing for about a week.  Placed on indomethacin by a telehealth physician.  He reports that has not been any improvement in symptoms.  There is some worsening pain with leaning forward.  No prior history of IV drug use, no shortness of breath but does report significant discomfort.  Blood work here is unremarkable.  Patient was screened in triage.  Did obtain a ultrasound of his gallbladder which did not show any acute findings.  Chest x-ray was clear without signs of pneumonia.  No concern for ACS after a negative workup with negative troponin. Vitals have been  stable here, suspect MSK versus dissection.  We discussed further evaluation.  CT angio chest did not show any dissection.  Dimer levels negative.  He does not have any respecters for pulmonary embolism.  I did discuss with him likely MSK etiology, will go home on a short course of muscle relaxers along with antiacid inflammatories.  He is agreeable to plan and treatment at this time.  Patient is hemodynamically stable for discharge.   Dispostion:  After consideration of the diagnostic results and the patients response to treatment, I feel that the patent would benefit from supportive therapy for MSK pain.    Portions of this note were generated with Scientist, clinical (histocompatibility and immunogenetics). Dictation errors may occur despite best attempts at proofreading.   Final diagnoses:  Acute midline thoracic back pain    ED Discharge Orders          Ordered    predniSONE  (DELTASONE ) 20 MG tablet  Daily        08/21/24 1850    cyclobenzaprine  (FLEXERIL ) 10 MG tablet  3 times daily  08/21/24 1850               Erminia Mcnew, PA-C 08/21/24 1855    Simon Lavonia SAILOR, MD 08/21/24 2233

## 2024-08-21 NOTE — ED Provider Triage Note (Signed)
 Emergency Medicine Provider Triage Evaluation Note  Micheal Pratt , a 33 y.o. male  was evaluated in triage.  Pt complains of 2 weeks of severe pain in his torso.  Review of Systems  Positive: Started with epigastric abdominal discomfort that has improved but now has pain across the shoulder blades and left shoulder blade specifically.  It is pleuritic. Negative: Shortness of breath, nausea, vomiting, diarrhea, constipation, fevers, chills, urinary changes  Physical Exam  BP (!) 143/91 (BP Location: Right Arm)   Pulse 95   Temp 98.3 F (36.8 C)   Resp 16   Ht 5' 9 (1.753 m)   Wt 81.6 kg   SpO2 95%   BMI 26.58 kg/m  Gen:   Awake, no distress   Resp:  Normal effort  MSK:   Moves extremities without difficulty.  Tenderness across the shoulder blades and left shoulder blade. Other:  Abdomen nontender.  Good bowel sounds.  Medical Decision Making  Medically screening exam initiated at 9:55 AM.  Appropriate orders placed.  Tomothy Eddins was informed that the remainder of the evaluation will be completed by another provider, this initial triage assessment does not replace that evaluation, and the importance of remaining in the ED until their evaluation is complete.  Micheal Pratt is a 33 y.o. male with no significant past medical history who presents at the direction of PCP for further evaluation of pain.  According to patient, he started having pain about 2 weeks ago and it continues to worsen and has not improved.  He reports started as some pain in his upper abdomen and right upper quadrant but then moved into his back.  He reports it is a soreness pain across his shoulder blades and is not in the low back now.  It is also in the left shoulder blade.  He reports it hurts to take a deep breath but he is not any fevers chills or chest pain initially.  Denies shortness of breath but does hurt to take a deep breath.  He is denying nausea, vomiting, constipation, diarrhea, urine drainage  changes.  He has no history of gallbladder disease and has no history of kidney stones.  He denies urinary changes.  Reports no long flights drives or travel and denies leg pain or leg swelling.  Reports the pain is 9 out of 10 at this time.  He reports his PCP wrote him for indomethacin and that has not seemed to help.  He has not taken the last few days as it was not helping much.  On exam, lungs were clear.  Chest is nontender.  No murmur.  He does have tenderness across his back between his shoulder blades in the left shoulder blade specifically.  I do not see rash to suggest shingles.  Abdomen was nontender but he did report his previous tenderness was in the right upper quadrant and epigastric area.  Denies history of gallbladder disease.  I did hear bowel sounds.  Legs had intact sensation strength and pulses and patient otherwise well-appearing.  Good pulses in upper extremities.  Given his 2 weeks of 9 out of 10 pain that has been involving and location I do feel he needs some workup today.  With the pain that started in the epigastric and right upper quadrant and now having pain in his left shoulder blade I do feel he needs a gallbladder evaluation.  Will get an ultrasound and some screening labs.  With the pleuritic discomfort across the shoulder blade on  the back, we will get a cardiac workup and as he was tachycardic over 100 when I was evaluating him myself, will add on a D-dimer to rule out a thromboembolic etiology causing pleuritic torso discomfort as he is unable to the [facility] rule with this tachycardia.  We will also get urinalysis given the pain in his back to rule out a urinary etiology.  We discussed it may be musculoskeletal but due to the persistence and worsening symptoms he is appropriate for some workup today.  Anticipate further evaluation by a provider when he gets to an exam room for workup and management.         Roston Grunewald, Lonni PARAS, MD 08/21/24 [phone]

## 2024-08-21 NOTE — Discharge Instructions (Addendum)
 You were given you are given a prescription for steroids.  Please take 2 tablets daily for the next 5 days.  In addition, you were given a short course of muscle relaxers to help with your discomfort.  Please take 1 tablet up to 3 times a day for your dose.  Please be aware this medication can make you drowsy.  Do not drink alcohol or drive while taking this medication.

## 2024-08-23 ENCOUNTER — Encounter: Payer: Self-pay | Admitting: Emergency Medicine
